# Patient Record
Sex: Male | Born: 1945 | Race: White | Hispanic: No | Marital: Married | Smoking: Current every day smoker
Health system: Southern US, Community
[De-identification: ages and names within clinical notes are randomized; demographics above are authoritative.]

## PROBLEM LIST (undated history)

## (undated) DIAGNOSIS — I10 Essential (primary) hypertension: Secondary | ICD-10-CM

---

## 2008-01-26 ENCOUNTER — Emergency Department (HOSPITAL_COMMUNITY): Admission: EM | Admit: 2008-01-26 | Discharge: 2008-01-26 | Payer: Self-pay | Admitting: Emergency Medicine

## 2011-08-16 DIAGNOSIS — R05 Cough: Secondary | ICD-10-CM | POA: Diagnosis not present

## 2011-08-16 DIAGNOSIS — H6690 Otitis media, unspecified, unspecified ear: Secondary | ICD-10-CM | POA: Diagnosis not present

## 2011-12-26 DIAGNOSIS — I10 Essential (primary) hypertension: Secondary | ICD-10-CM | POA: Diagnosis not present

## 2011-12-26 DIAGNOSIS — F411 Generalized anxiety disorder: Secondary | ICD-10-CM | POA: Diagnosis not present

## 2011-12-26 DIAGNOSIS — E785 Hyperlipidemia, unspecified: Secondary | ICD-10-CM | POA: Diagnosis not present

## 2012-04-25 DIAGNOSIS — Z23 Encounter for immunization: Secondary | ICD-10-CM | POA: Diagnosis not present

## 2012-05-05 DIAGNOSIS — J029 Acute pharyngitis, unspecified: Secondary | ICD-10-CM | POA: Diagnosis not present

## 2012-05-05 DIAGNOSIS — J069 Acute upper respiratory infection, unspecified: Secondary | ICD-10-CM | POA: Diagnosis not present

## 2012-09-08 DIAGNOSIS — E785 Hyperlipidemia, unspecified: Secondary | ICD-10-CM | POA: Diagnosis not present

## 2013-06-11 DIAGNOSIS — Z23 Encounter for immunization: Secondary | ICD-10-CM | POA: Diagnosis not present

## 2013-09-09 DIAGNOSIS — F411 Generalized anxiety disorder: Secondary | ICD-10-CM | POA: Diagnosis not present

## 2013-09-09 DIAGNOSIS — E785 Hyperlipidemia, unspecified: Secondary | ICD-10-CM | POA: Diagnosis not present

## 2013-09-09 DIAGNOSIS — F17200 Nicotine dependence, unspecified, uncomplicated: Secondary | ICD-10-CM | POA: Diagnosis not present

## 2013-09-09 DIAGNOSIS — I10 Essential (primary) hypertension: Secondary | ICD-10-CM | POA: Diagnosis not present

## 2014-04-10 DIAGNOSIS — I10 Essential (primary) hypertension: Secondary | ICD-10-CM | POA: Diagnosis not present

## 2014-04-10 DIAGNOSIS — E785 Hyperlipidemia, unspecified: Secondary | ICD-10-CM | POA: Diagnosis not present

## 2014-04-10 DIAGNOSIS — F411 Generalized anxiety disorder: Secondary | ICD-10-CM | POA: Diagnosis not present

## 2014-06-02 DIAGNOSIS — H10023 Other mucopurulent conjunctivitis, bilateral: Secondary | ICD-10-CM | POA: Diagnosis not present

## 2014-06-02 DIAGNOSIS — Z23 Encounter for immunization: Secondary | ICD-10-CM | POA: Diagnosis not present

## 2014-08-25 DIAGNOSIS — Z961 Presence of intraocular lens: Secondary | ICD-10-CM | POA: Diagnosis not present

## 2014-08-25 DIAGNOSIS — H11151 Pinguecula, right eye: Secondary | ICD-10-CM | POA: Diagnosis not present

## 2014-08-25 DIAGNOSIS — H11152 Pinguecula, left eye: Secondary | ICD-10-CM | POA: Diagnosis not present

## 2014-10-09 DIAGNOSIS — E785 Hyperlipidemia, unspecified: Secondary | ICD-10-CM | POA: Diagnosis not present

## 2014-10-09 DIAGNOSIS — F419 Anxiety disorder, unspecified: Secondary | ICD-10-CM | POA: Diagnosis not present

## 2014-10-09 DIAGNOSIS — Z23 Encounter for immunization: Secondary | ICD-10-CM | POA: Diagnosis not present

## 2014-10-09 DIAGNOSIS — Z1159 Encounter for screening for other viral diseases: Secondary | ICD-10-CM | POA: Diagnosis not present

## 2014-10-09 DIAGNOSIS — Z125 Encounter for screening for malignant neoplasm of prostate: Secondary | ICD-10-CM | POA: Diagnosis not present

## 2014-10-09 DIAGNOSIS — Z720 Tobacco use: Secondary | ICD-10-CM | POA: Diagnosis not present

## 2014-10-09 DIAGNOSIS — Z0000 Encounter for general adult medical examination without abnormal findings: Secondary | ICD-10-CM | POA: Diagnosis not present

## 2014-10-09 DIAGNOSIS — I10 Essential (primary) hypertension: Secondary | ICD-10-CM | POA: Diagnosis not present

## 2015-05-26 DIAGNOSIS — Z23 Encounter for immunization: Secondary | ICD-10-CM | POA: Diagnosis not present

## 2015-10-09 DIAGNOSIS — H8113 Benign paroxysmal vertigo, bilateral: Secondary | ICD-10-CM | POA: Diagnosis not present

## 2015-10-09 DIAGNOSIS — F419 Anxiety disorder, unspecified: Secondary | ICD-10-CM | POA: Diagnosis not present

## 2015-10-09 DIAGNOSIS — I10 Essential (primary) hypertension: Secondary | ICD-10-CM | POA: Diagnosis not present

## 2015-10-09 DIAGNOSIS — Z125 Encounter for screening for malignant neoplasm of prostate: Secondary | ICD-10-CM | POA: Diagnosis not present

## 2015-10-09 DIAGNOSIS — E785 Hyperlipidemia, unspecified: Secondary | ICD-10-CM | POA: Diagnosis not present

## 2015-10-09 DIAGNOSIS — Z720 Tobacco use: Secondary | ICD-10-CM | POA: Diagnosis not present

## 2016-03-28 DIAGNOSIS — J069 Acute upper respiratory infection, unspecified: Secondary | ICD-10-CM | POA: Diagnosis not present

## 2016-04-08 DIAGNOSIS — F419 Anxiety disorder, unspecified: Secondary | ICD-10-CM | POA: Diagnosis not present

## 2016-04-08 DIAGNOSIS — I10 Essential (primary) hypertension: Secondary | ICD-10-CM | POA: Diagnosis not present

## 2016-04-08 DIAGNOSIS — E785 Hyperlipidemia, unspecified: Secondary | ICD-10-CM | POA: Diagnosis not present

## 2016-04-08 DIAGNOSIS — Z720 Tobacco use: Secondary | ICD-10-CM | POA: Diagnosis not present

## 2016-05-23 DIAGNOSIS — Z23 Encounter for immunization: Secondary | ICD-10-CM | POA: Diagnosis not present

## 2016-09-14 DIAGNOSIS — R509 Fever, unspecified: Secondary | ICD-10-CM | POA: Diagnosis not present

## 2016-09-14 DIAGNOSIS — J101 Influenza due to other identified influenza virus with other respiratory manifestations: Secondary | ICD-10-CM | POA: Diagnosis not present

## 2016-09-14 DIAGNOSIS — R05 Cough: Secondary | ICD-10-CM | POA: Diagnosis not present

## 2016-09-21 DIAGNOSIS — R05 Cough: Secondary | ICD-10-CM | POA: Diagnosis not present

## 2016-09-21 DIAGNOSIS — J180 Bronchopneumonia, unspecified organism: Secondary | ICD-10-CM | POA: Diagnosis not present

## 2016-10-09 DIAGNOSIS — Z720 Tobacco use: Secondary | ICD-10-CM | POA: Diagnosis not present

## 2016-10-09 DIAGNOSIS — I10 Essential (primary) hypertension: Secondary | ICD-10-CM | POA: Diagnosis not present

## 2016-10-09 DIAGNOSIS — Z125 Encounter for screening for malignant neoplasm of prostate: Secondary | ICD-10-CM | POA: Diagnosis not present

## 2016-10-09 DIAGNOSIS — F419 Anxiety disorder, unspecified: Secondary | ICD-10-CM | POA: Diagnosis not present

## 2016-10-09 DIAGNOSIS — E7800 Pure hypercholesterolemia, unspecified: Secondary | ICD-10-CM | POA: Diagnosis not present

## 2016-11-25 DIAGNOSIS — R748 Abnormal levels of other serum enzymes: Secondary | ICD-10-CM | POA: Diagnosis not present

## 2017-04-14 DIAGNOSIS — Z23 Encounter for immunization: Secondary | ICD-10-CM | POA: Diagnosis not present

## 2017-04-14 DIAGNOSIS — E7800 Pure hypercholesterolemia, unspecified: Secondary | ICD-10-CM | POA: Diagnosis not present

## 2017-04-14 DIAGNOSIS — I10 Essential (primary) hypertension: Secondary | ICD-10-CM | POA: Diagnosis not present

## 2017-04-14 DIAGNOSIS — F419 Anxiety disorder, unspecified: Secondary | ICD-10-CM | POA: Diagnosis not present

## 2017-04-14 DIAGNOSIS — Z720 Tobacco use: Secondary | ICD-10-CM | POA: Diagnosis not present

## 2017-09-04 DIAGNOSIS — J069 Acute upper respiratory infection, unspecified: Secondary | ICD-10-CM | POA: Diagnosis not present

## 2017-11-25 DIAGNOSIS — Z720 Tobacco use: Secondary | ICD-10-CM | POA: Diagnosis not present

## 2017-11-25 DIAGNOSIS — Z125 Encounter for screening for malignant neoplasm of prostate: Secondary | ICD-10-CM | POA: Diagnosis not present

## 2017-11-25 DIAGNOSIS — F419 Anxiety disorder, unspecified: Secondary | ICD-10-CM | POA: Diagnosis not present

## 2017-11-25 DIAGNOSIS — E7800 Pure hypercholesterolemia, unspecified: Secondary | ICD-10-CM | POA: Diagnosis not present

## 2017-11-25 DIAGNOSIS — I10 Essential (primary) hypertension: Secondary | ICD-10-CM | POA: Diagnosis not present

## 2018-05-26 DIAGNOSIS — I10 Essential (primary) hypertension: Secondary | ICD-10-CM | POA: Diagnosis not present

## 2018-05-26 DIAGNOSIS — Z0000 Encounter for general adult medical examination without abnormal findings: Secondary | ICD-10-CM | POA: Diagnosis not present

## 2018-05-26 DIAGNOSIS — F419 Anxiety disorder, unspecified: Secondary | ICD-10-CM | POA: Diagnosis not present

## 2018-05-26 DIAGNOSIS — E7800 Pure hypercholesterolemia, unspecified: Secondary | ICD-10-CM | POA: Diagnosis not present

## 2018-05-26 DIAGNOSIS — Z23 Encounter for immunization: Secondary | ICD-10-CM | POA: Diagnosis not present

## 2018-05-26 DIAGNOSIS — Z720 Tobacco use: Secondary | ICD-10-CM | POA: Diagnosis not present

## 2018-12-01 DIAGNOSIS — E7800 Pure hypercholesterolemia, unspecified: Secondary | ICD-10-CM | POA: Diagnosis not present

## 2018-12-01 DIAGNOSIS — I10 Essential (primary) hypertension: Secondary | ICD-10-CM | POA: Diagnosis not present

## 2018-12-01 DIAGNOSIS — F419 Anxiety disorder, unspecified: Secondary | ICD-10-CM | POA: Diagnosis not present

## 2018-12-01 DIAGNOSIS — Z720 Tobacco use: Secondary | ICD-10-CM | POA: Diagnosis not present

## 2019-06-13 DIAGNOSIS — Z23 Encounter for immunization: Secondary | ICD-10-CM | POA: Diagnosis not present

## 2019-09-02 ENCOUNTER — Other Ambulatory Visit: Payer: Self-pay | Admitting: Family Medicine

## 2019-09-02 ENCOUNTER — Ambulatory Visit
Admission: RE | Admit: 2019-09-02 | Discharge: 2019-09-02 | Disposition: A | Discharge status: Home / Self Care | Payer: Medicare Other | Source: Ambulatory Visit | Attending: Family Medicine | Admitting: Family Medicine

## 2019-09-02 DIAGNOSIS — R0989 Other specified symptoms and signs involving the circulatory and respiratory systems: Secondary | ICD-10-CM

## 2019-09-14 DIAGNOSIS — E7800 Pure hypercholesterolemia, unspecified: Secondary | ICD-10-CM | POA: Diagnosis not present

## 2019-09-14 DIAGNOSIS — Z720 Tobacco use: Secondary | ICD-10-CM | POA: Diagnosis not present

## 2019-09-14 DIAGNOSIS — F419 Anxiety disorder, unspecified: Secondary | ICD-10-CM | POA: Diagnosis not present

## 2019-09-14 DIAGNOSIS — K219 Gastro-esophageal reflux disease without esophagitis: Secondary | ICD-10-CM | POA: Diagnosis not present

## 2019-09-14 DIAGNOSIS — I10 Essential (primary) hypertension: Secondary | ICD-10-CM | POA: Diagnosis not present

## 2020-03-01 DIAGNOSIS — H10503 Unspecified blepharoconjunctivitis, bilateral: Secondary | ICD-10-CM | POA: Diagnosis not present

## 2020-04-11 DIAGNOSIS — E7800 Pure hypercholesterolemia, unspecified: Secondary | ICD-10-CM | POA: Diagnosis not present

## 2020-04-11 DIAGNOSIS — I10 Essential (primary) hypertension: Secondary | ICD-10-CM | POA: Diagnosis not present

## 2020-06-18 DIAGNOSIS — Z23 Encounter for immunization: Secondary | ICD-10-CM | POA: Diagnosis not present

## 2020-06-21 DIAGNOSIS — I10 Essential (primary) hypertension: Secondary | ICD-10-CM | POA: Diagnosis not present

## 2020-06-21 DIAGNOSIS — K219 Gastro-esophageal reflux disease without esophagitis: Secondary | ICD-10-CM | POA: Diagnosis not present

## 2020-06-21 DIAGNOSIS — E7800 Pure hypercholesterolemia, unspecified: Secondary | ICD-10-CM | POA: Diagnosis not present

## 2020-07-12 DIAGNOSIS — I10 Essential (primary) hypertension: Secondary | ICD-10-CM | POA: Diagnosis not present

## 2020-07-12 DIAGNOSIS — F4321 Adjustment disorder with depressed mood: Secondary | ICD-10-CM | POA: Diagnosis not present

## 2020-07-12 DIAGNOSIS — E7800 Pure hypercholesterolemia, unspecified: Secondary | ICD-10-CM | POA: Diagnosis not present

## 2020-07-12 DIAGNOSIS — K219 Gastro-esophageal reflux disease without esophagitis: Secondary | ICD-10-CM | POA: Diagnosis not present

## 2020-07-18 DIAGNOSIS — Z23 Encounter for immunization: Secondary | ICD-10-CM | POA: Diagnosis not present

## 2020-08-06 DIAGNOSIS — Z20822 Contact with and (suspected) exposure to covid-19: Secondary | ICD-10-CM | POA: Diagnosis not present

## 2020-08-08 DIAGNOSIS — F4321 Adjustment disorder with depressed mood: Secondary | ICD-10-CM | POA: Diagnosis not present

## 2020-10-03 DIAGNOSIS — Z1211 Encounter for screening for malignant neoplasm of colon: Secondary | ICD-10-CM | POA: Diagnosis not present

## 2020-10-03 DIAGNOSIS — F419 Anxiety disorder, unspecified: Secondary | ICD-10-CM | POA: Diagnosis not present

## 2020-10-03 DIAGNOSIS — H1013 Acute atopic conjunctivitis, bilateral: Secondary | ICD-10-CM | POA: Diagnosis not present

## 2020-10-03 DIAGNOSIS — Z0000 Encounter for general adult medical examination without abnormal findings: Secondary | ICD-10-CM | POA: Diagnosis not present

## 2020-10-03 DIAGNOSIS — I10 Essential (primary) hypertension: Secondary | ICD-10-CM | POA: Diagnosis not present

## 2020-10-03 DIAGNOSIS — E7800 Pure hypercholesterolemia, unspecified: Secondary | ICD-10-CM | POA: Diagnosis not present

## 2020-10-03 DIAGNOSIS — Z720 Tobacco use: Secondary | ICD-10-CM | POA: Diagnosis not present

## 2020-10-31 DIAGNOSIS — H0289 Other specified disorders of eyelid: Secondary | ICD-10-CM | POA: Diagnosis not present

## 2020-11-23 DIAGNOSIS — I10 Essential (primary) hypertension: Secondary | ICD-10-CM | POA: Diagnosis not present

## 2020-11-23 DIAGNOSIS — E7800 Pure hypercholesterolemia, unspecified: Secondary | ICD-10-CM | POA: Diagnosis not present

## 2020-11-23 DIAGNOSIS — K219 Gastro-esophageal reflux disease without esophagitis: Secondary | ICD-10-CM | POA: Diagnosis not present

## 2020-11-23 DIAGNOSIS — F4321 Adjustment disorder with depressed mood: Secondary | ICD-10-CM | POA: Diagnosis not present

## 2020-11-28 DIAGNOSIS — H25043 Posterior subcapsular polar age-related cataract, bilateral: Secondary | ICD-10-CM | POA: Diagnosis not present

## 2020-12-20 DIAGNOSIS — Z20822 Contact with and (suspected) exposure to covid-19: Secondary | ICD-10-CM | POA: Diagnosis not present

## 2021-01-24 DIAGNOSIS — F4321 Adjustment disorder with depressed mood: Secondary | ICD-10-CM | POA: Diagnosis not present

## 2021-01-24 DIAGNOSIS — I10 Essential (primary) hypertension: Secondary | ICD-10-CM | POA: Diagnosis not present

## 2021-01-24 DIAGNOSIS — K219 Gastro-esophageal reflux disease without esophagitis: Secondary | ICD-10-CM | POA: Diagnosis not present

## 2021-01-24 DIAGNOSIS — E7800 Pure hypercholesterolemia, unspecified: Secondary | ICD-10-CM | POA: Diagnosis not present

## 2021-05-08 DIAGNOSIS — E7800 Pure hypercholesterolemia, unspecified: Secondary | ICD-10-CM | POA: Diagnosis not present

## 2021-05-08 DIAGNOSIS — F324 Major depressive disorder, single episode, in partial remission: Secondary | ICD-10-CM | POA: Diagnosis not present

## 2021-05-08 DIAGNOSIS — Z720 Tobacco use: Secondary | ICD-10-CM | POA: Diagnosis not present

## 2021-05-08 DIAGNOSIS — F419 Anxiety disorder, unspecified: Secondary | ICD-10-CM | POA: Diagnosis not present

## 2021-05-08 DIAGNOSIS — I10 Essential (primary) hypertension: Secondary | ICD-10-CM | POA: Diagnosis not present

## 2021-08-10 DIAGNOSIS — Z23 Encounter for immunization: Secondary | ICD-10-CM | POA: Diagnosis not present

## 2021-09-15 DIAGNOSIS — S61412D Laceration without foreign body of left hand, subsequent encounter: Secondary | ICD-10-CM | POA: Diagnosis not present

## 2021-10-23 DIAGNOSIS — I10 Essential (primary) hypertension: Secondary | ICD-10-CM | POA: Diagnosis not present

## 2021-12-17 IMAGING — CR DG CHEST 2V
2 series · 2 of 2 positions shown · non-contrast
Comparison: September 21, 2016

CLINICAL DATA: Chest congestion.

EXAM:
CHEST - 2 VIEW

[w chest pa]
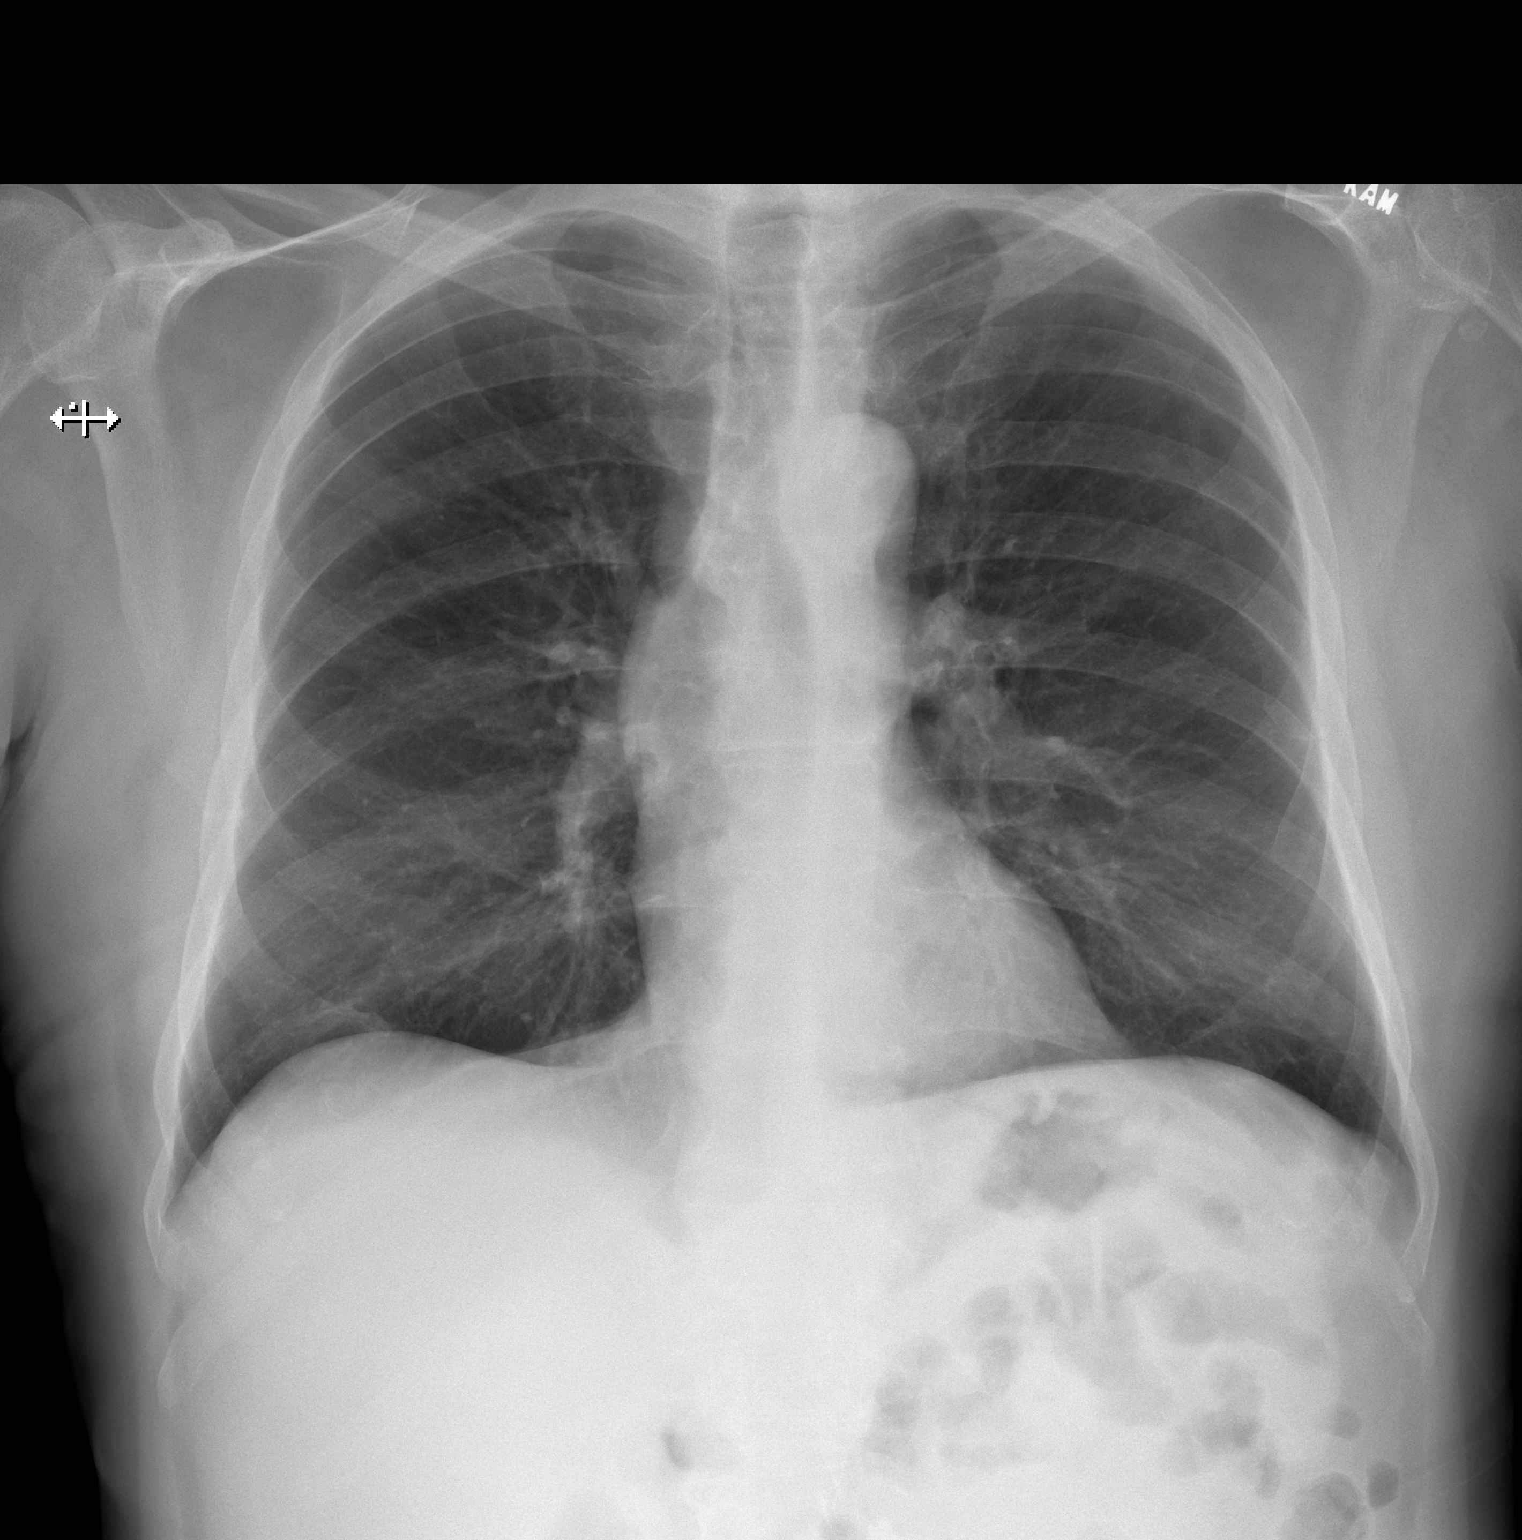

[w chest lat]
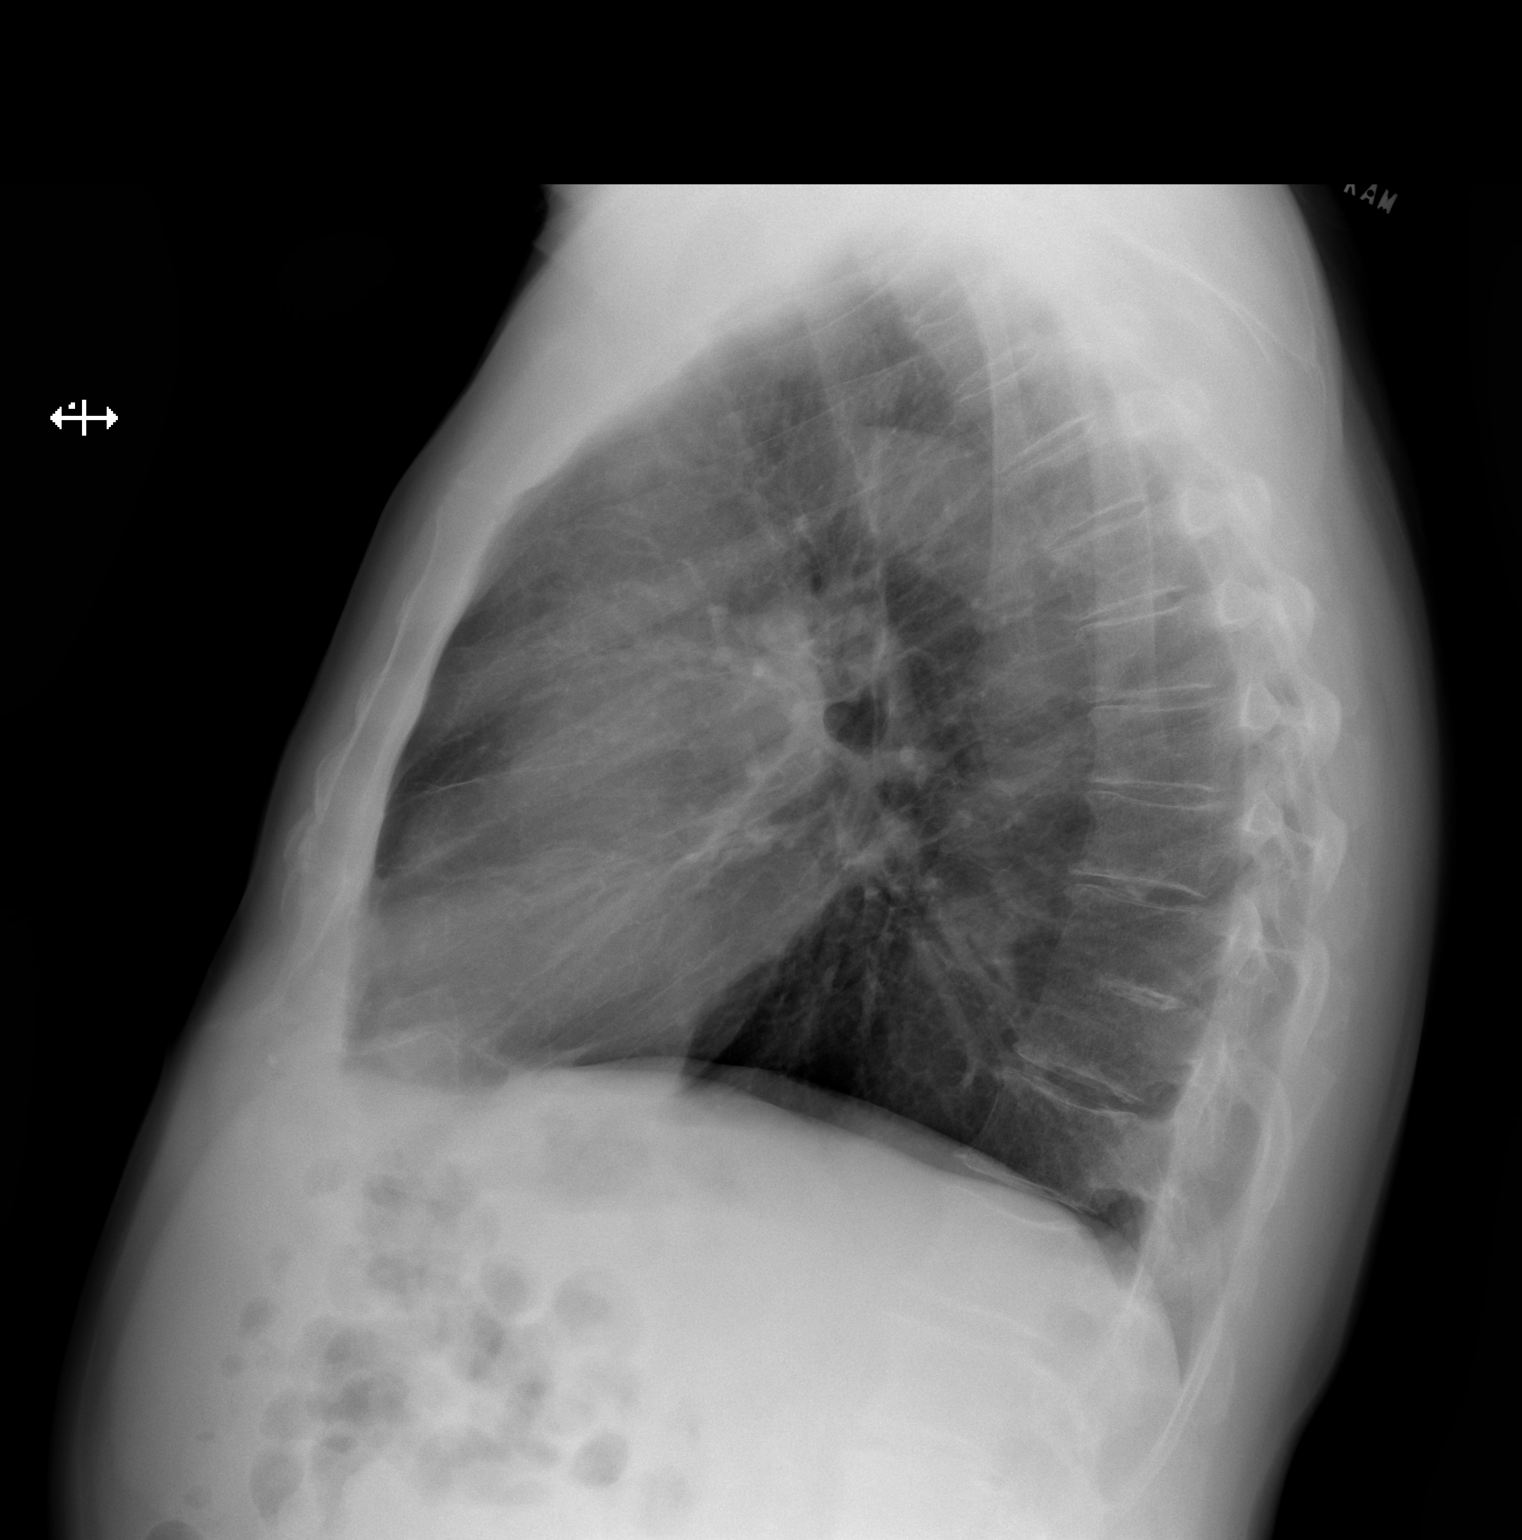

[2 of 2 positions shown; findings below may reference images not displayed]

FINDINGS: There is no evidence of acute infiltrate, pleural effusion or
pneumothorax. The heart size and mediastinal contours are within
normal limits. The visualized skeletal structures are unremarkable.
IMPRESSION: No active cardiopulmonary disease.

## 2022-01-29 DIAGNOSIS — F324 Major depressive disorder, single episode, in partial remission: Secondary | ICD-10-CM | POA: Diagnosis not present

## 2022-01-29 DIAGNOSIS — K219 Gastro-esophageal reflux disease without esophagitis: Secondary | ICD-10-CM | POA: Diagnosis not present

## 2022-01-29 DIAGNOSIS — F419 Anxiety disorder, unspecified: Secondary | ICD-10-CM | POA: Diagnosis not present

## 2022-01-29 DIAGNOSIS — Z0000 Encounter for general adult medical examination without abnormal findings: Secondary | ICD-10-CM | POA: Diagnosis not present

## 2022-01-29 DIAGNOSIS — Z1211 Encounter for screening for malignant neoplasm of colon: Secondary | ICD-10-CM | POA: Diagnosis not present

## 2022-01-29 DIAGNOSIS — E7800 Pure hypercholesterolemia, unspecified: Secondary | ICD-10-CM | POA: Diagnosis not present

## 2022-01-29 DIAGNOSIS — I10 Essential (primary) hypertension: Secondary | ICD-10-CM | POA: Diagnosis not present

## 2022-01-29 DIAGNOSIS — Z720 Tobacco use: Secondary | ICD-10-CM | POA: Diagnosis not present

## 2022-02-26 DIAGNOSIS — D72829 Elevated white blood cell count, unspecified: Secondary | ICD-10-CM | POA: Diagnosis not present

## 2022-08-27 ENCOUNTER — Encounter (HOSPITAL_COMMUNITY): Payer: Self-pay | Admitting: *Deleted

## 2022-08-27 ENCOUNTER — Emergency Department (HOSPITAL_COMMUNITY)
Admission: EM | Admit: 2022-08-27 | Discharge: 2022-08-28 | Discharge status: Against Medical Advice | Payer: Medicare HMO | Attending: Emergency Medicine | Admitting: Emergency Medicine

## 2022-08-27 ENCOUNTER — Other Ambulatory Visit: Payer: Self-pay

## 2022-08-27 DIAGNOSIS — R296 Repeated falls: Secondary | ICD-10-CM | POA: Diagnosis not present

## 2022-08-27 DIAGNOSIS — R55 Syncope and collapse: Secondary | ICD-10-CM | POA: Diagnosis present

## 2022-08-27 DIAGNOSIS — Z5321 Procedure and treatment not carried out due to patient leaving prior to being seen by health care provider: Secondary | ICD-10-CM | POA: Insufficient documentation

## 2022-08-27 HISTORY — DX: Essential (primary) hypertension: I10

## 2022-08-27 LAB — CBG MONITORING, ED: Glucose-Capillary: 105 mg/dL — ABNORMAL HIGH (ref 70–99)

## 2022-08-27 LAB — COMPREHENSIVE METABOLIC PANEL
ALT: 71 U/L — ABNORMAL HIGH (ref 0–44)
AST: 142 U/L — ABNORMAL HIGH (ref 15–41)
Albumin: 3.7 g/dL (ref 3.5–5.0)
Alkaline Phosphatase: 55 U/L (ref 38–126)
Anion gap: 13 (ref 5–15)
BUN: 6 mg/dL — ABNORMAL LOW (ref 8–23)
CO2: 22 mmol/L (ref 22–32)
Calcium: 8.6 mg/dL — ABNORMAL LOW (ref 8.9–10.3)
Chloride: 94 mmol/L — ABNORMAL LOW (ref 98–111)
Creatinine, Ser: 0.87 mg/dL (ref 0.61–1.24)
GFR, Estimated: >60 mL/min (ref >60)
Glucose, Bld: 97 mg/dL (ref 70–99)
Potassium: 3.5 mmol/L (ref 3.5–5.1)
Sodium: 129 mmol/L — ABNORMAL LOW (ref 135–145)
Total Bilirubin: 0.8 mg/dL (ref 0.3–1.2)
Total Protein: 5.9 g/dL — ABNORMAL LOW (ref 6.5–8.1)

## 2022-08-27 LAB — CBC WITH DIFFERENTIAL/PLATELET
Abs Immature Granulocytes: 0.03 10*3/uL (ref 0.00–0.07)
Basophils Absolute: 0.1 10*3/uL (ref 0.0–0.1)
Basophils Relative: 1 %
Eosinophils Absolute: 0.1 10*3/uL (ref 0.0–0.5)
Eosinophils Relative: 1 %
HCT: 38.4 % — ABNORMAL LOW (ref 39.0–52.0)
Hemoglobin: 14 g/dL (ref 13.0–17.0)
Immature Granulocytes: 0 %
Lymphocytes Relative: 22 %
Lymphs Abs: 2.2 10*3/uL (ref 0.7–4.0)
MCH: 35.9 pg — ABNORMAL HIGH (ref 26.0–34.0)
MCHC: 36.5 g/dL — ABNORMAL HIGH (ref 30.0–36.0)
MCV: 98.5 fL (ref 80.0–100.0)
Monocytes Absolute: 0.7 10*3/uL (ref 0.1–1.0)
Monocytes Relative: 6 %
Neutro Abs: 7.3 10*3/uL (ref 1.7–7.7)
Neutrophils Relative %: 70 %
Platelets: 176 10*3/uL (ref 150–400)
RBC: 3.9 MIL/uL — ABNORMAL LOW (ref 4.22–5.81)
RDW: 12.7 % (ref 11.5–15.5)
WBC: 10.3 10*3/uL (ref 4.0–10.5)
nRBC: 0 % (ref 0.0–0.2)

## 2022-08-27 LAB — ETHANOL: Alcohol, Ethyl (B): 66 mg/dL — ABNORMAL HIGH (ref <10)

## 2022-08-27 NOTE — ED Triage Notes (Addendum)
Patient arrived with EMS from home reports multiple falls today with near syncopal episodes . CBG= 109, he received 500 ml IV prior to arrival by EMS BP= 116/64 after hydration .

## 2022-08-27 NOTE — ED Triage Notes (Signed)
The pt reports that he has had diarrhea and feeling weak

## 2022-08-27 NOTE — ED Provider Triage Note (Addendum)
Emergency Medicine Provider Triage Evaluation Note  KAY RICCIUTI , a 77 y.o. male  was evaluated in triage.  Pt complains of multiple falls at home over the last 2 days.  He has also had syncopal episodes where he feels like he "blacks out for a second".  Reports recent diarrhea with weakness and feeling dehydrated.  Denies chest pain, shortness of breath, fever, confusion.  Does not take blood thinners.  Wife recently passed away.  Hx of alcohol use.   Review of Systems  Positive: As above Negative: As above  Physical Exam  BP 107/65 (BP Location: Right Arm)   Pulse 78   Temp 97.7 F (36.5 C)   Resp 17   Ht 5\' 10"  (1.778 m)   Wt 77.1 kg   SpO2 96%   BMI 24.39 kg/m  Gen:   Awake, no distress   Resp:  Normal effort  MSK:   Moves extremities without difficulty  Other:    Medical Decision Making  Medically screening exam initiated at 9:38 PM.  Appropriate orders placed.  TRESHAUN CARRICO was informed that the remainder of the evaluation will be completed by another provider, this initial triage assessment does not replace that evaluation, and the importance of remaining in the ED until their evaluation is complete.     Theressa Stamps R, [place] 08/27/22 2140    Theressa Stamps R, [place] 08/27/22 2145

## 2022-08-28 NOTE — ED Notes (Signed)
PT requested for IV line to be removed and stated he didn't want to wait anymore.

## 2024-01-19 DIAGNOSIS — I10 Essential (primary) hypertension: Secondary | ICD-10-CM | POA: Diagnosis not present

## 2024-02-08 DIAGNOSIS — E7800 Pure hypercholesterolemia, unspecified: Secondary | ICD-10-CM | POA: Diagnosis not present

## 2024-02-08 DIAGNOSIS — F324 Major depressive disorder, single episode, in partial remission: Secondary | ICD-10-CM | POA: Diagnosis not present

## 2024-02-08 DIAGNOSIS — I10 Essential (primary) hypertension: Secondary | ICD-10-CM | POA: Diagnosis not present

## 2024-02-17 DIAGNOSIS — I10 Essential (primary) hypertension: Secondary | ICD-10-CM | POA: Diagnosis not present

## 2024-03-07 DIAGNOSIS — K219 Gastro-esophageal reflux disease without esophagitis: Secondary | ICD-10-CM | POA: Diagnosis not present

## 2024-03-07 DIAGNOSIS — F5101 Primary insomnia: Secondary | ICD-10-CM | POA: Diagnosis not present

## 2024-03-07 DIAGNOSIS — Z0000 Encounter for general adult medical examination without abnormal findings: Secondary | ICD-10-CM | POA: Diagnosis not present

## 2024-03-07 DIAGNOSIS — E7800 Pure hypercholesterolemia, unspecified: Secondary | ICD-10-CM | POA: Diagnosis not present

## 2024-03-07 DIAGNOSIS — Z720 Tobacco use: Secondary | ICD-10-CM | POA: Diagnosis not present

## 2024-03-07 DIAGNOSIS — R319 Hematuria, unspecified: Secondary | ICD-10-CM | POA: Diagnosis not present

## 2024-03-07 DIAGNOSIS — I10 Essential (primary) hypertension: Secondary | ICD-10-CM | POA: Diagnosis not present

## 2024-03-07 DIAGNOSIS — F324 Major depressive disorder, single episode, in partial remission: Secondary | ICD-10-CM | POA: Diagnosis not present

## 2024-03-10 DIAGNOSIS — F324 Major depressive disorder, single episode, in partial remission: Secondary | ICD-10-CM | POA: Diagnosis not present

## 2024-03-10 DIAGNOSIS — E7800 Pure hypercholesterolemia, unspecified: Secondary | ICD-10-CM | POA: Diagnosis not present

## 2024-03-10 DIAGNOSIS — I10 Essential (primary) hypertension: Secondary | ICD-10-CM | POA: Diagnosis not present

## 2024-03-18 DIAGNOSIS — I10 Essential (primary) hypertension: Secondary | ICD-10-CM | POA: Diagnosis not present

## 2024-04-10 DIAGNOSIS — F324 Major depressive disorder, single episode, in partial remission: Secondary | ICD-10-CM | POA: Diagnosis not present

## 2024-04-10 DIAGNOSIS — E7800 Pure hypercholesterolemia, unspecified: Secondary | ICD-10-CM | POA: Diagnosis not present

## 2024-04-10 DIAGNOSIS — I10 Essential (primary) hypertension: Secondary | ICD-10-CM | POA: Diagnosis not present

## 2024-04-17 DIAGNOSIS — I10 Essential (primary) hypertension: Secondary | ICD-10-CM | POA: Diagnosis not present

## 2024-05-10 DIAGNOSIS — F324 Major depressive disorder, single episode, in partial remission: Secondary | ICD-10-CM | POA: Diagnosis not present

## 2024-05-10 DIAGNOSIS — I10 Essential (primary) hypertension: Secondary | ICD-10-CM | POA: Diagnosis not present

## 2024-05-10 DIAGNOSIS — E7800 Pure hypercholesterolemia, unspecified: Secondary | ICD-10-CM | POA: Diagnosis not present

## 2024-05-17 DIAGNOSIS — I10 Essential (primary) hypertension: Secondary | ICD-10-CM | POA: Diagnosis not present

## 2024-06-10 DIAGNOSIS — E7800 Pure hypercholesterolemia, unspecified: Secondary | ICD-10-CM | POA: Diagnosis not present

## 2024-06-10 DIAGNOSIS — I10 Essential (primary) hypertension: Secondary | ICD-10-CM | POA: Diagnosis not present

## 2024-06-10 DIAGNOSIS — F324 Major depressive disorder, single episode, in partial remission: Secondary | ICD-10-CM | POA: Diagnosis not present

## 2024-06-16 DIAGNOSIS — I10 Essential (primary) hypertension: Secondary | ICD-10-CM | POA: Diagnosis not present

## 2024-07-10 DIAGNOSIS — E7800 Pure hypercholesterolemia, unspecified: Secondary | ICD-10-CM | POA: Diagnosis not present

## 2024-07-10 DIAGNOSIS — I10 Essential (primary) hypertension: Secondary | ICD-10-CM | POA: Diagnosis not present

## 2024-07-10 DIAGNOSIS — F324 Major depressive disorder, single episode, in partial remission: Secondary | ICD-10-CM | POA: Diagnosis not present
